# Patient Record
Sex: Female | Born: 1935 | Race: Black or African American | Hispanic: No
Health system: Southern US, Community
[De-identification: ages and names within clinical notes are randomized; demographics above are authoritative.]

## PROBLEM LIST (undated history)

## (undated) DIAGNOSIS — K219 Gastro-esophageal reflux disease without esophagitis: Secondary | ICD-10-CM

## (undated) DIAGNOSIS — E7800 Pure hypercholesterolemia, unspecified: Secondary | ICD-10-CM

---

## 1998-05-29 ENCOUNTER — Emergency Department (HOSPITAL_COMMUNITY): Admission: EM | Admit: 1998-05-29 | Discharge: 1998-05-29 | Payer: Self-pay | Admitting: Emergency Medicine

## 1998-06-16 ENCOUNTER — Other Ambulatory Visit: Admission: RE | Admit: 1998-06-16 | Discharge: 1998-06-16 | Payer: Self-pay | Admitting: Gynecology

## 1998-08-12 ENCOUNTER — Other Ambulatory Visit: Admission: RE | Admit: 1998-08-12 | Discharge: 1998-08-12 | Payer: Self-pay | Admitting: Gynecology

## 2020-01-26 ENCOUNTER — Emergency Department (HOSPITAL_COMMUNITY): Payer: Medicare HMO

## 2020-01-26 ENCOUNTER — Emergency Department (HOSPITAL_COMMUNITY)
Admission: EM | Admit: 2020-01-26 | Discharge: 2020-01-26 | Disposition: A | Discharge status: Home / Self Care | Payer: Medicare HMO | Attending: Emergency Medicine | Admitting: Emergency Medicine

## 2020-01-26 ENCOUNTER — Other Ambulatory Visit: Payer: Self-pay

## 2020-01-26 ENCOUNTER — Encounter (HOSPITAL_COMMUNITY): Payer: Self-pay

## 2020-01-26 DIAGNOSIS — Z79899 Other long term (current) drug therapy: Secondary | ICD-10-CM | POA: Insufficient documentation

## 2020-01-26 DIAGNOSIS — R079 Chest pain, unspecified: Secondary | ICD-10-CM

## 2020-01-26 DIAGNOSIS — R42 Dizziness and giddiness: Secondary | ICD-10-CM

## 2020-01-26 DIAGNOSIS — R0789 Other chest pain: Secondary | ICD-10-CM | POA: Diagnosis not present

## 2020-01-26 DIAGNOSIS — R519 Headache, unspecified: Secondary | ICD-10-CM

## 2020-01-26 HISTORY — DX: Pure hypercholesterolemia, unspecified: E78.00

## 2020-01-26 HISTORY — DX: Gastro-esophageal reflux disease without esophagitis: K21.9

## 2020-01-26 LAB — CBC
HCT: 43.6 % (ref 36.0–46.0)
Hemoglobin: 14.1 g/dL (ref 12.0–15.0)
MCH: 28.8 pg (ref 26.0–34.0)
MCHC: 32.3 g/dL (ref 30.0–36.0)
MCV: 89.2 fL (ref 80.0–100.0)
Platelets: 250 10*3/uL (ref 150–400)
RBC: 4.89 MIL/uL (ref 3.87–5.11)
RDW: 12.3 % (ref 11.5–15.5)
WBC: 4.6 10*3/uL (ref 4.0–10.5)
nRBC: 0 % (ref 0.0–0.2)

## 2020-01-26 LAB — BASIC METABOLIC PANEL
Anion gap: 8 (ref 5–15)
BUN: 7 mg/dL — ABNORMAL LOW (ref 8–23)
CO2: 25 mmol/L (ref 22–32)
Calcium: 9.4 mg/dL (ref 8.9–10.3)
Chloride: 107 mmol/L (ref 98–111)
Creatinine, Ser: 0.83 mg/dL (ref 0.44–1.00)
GFR calc Af Amer: >60 mL/min (ref >60)
GFR calc non Af Amer: >60 mL/min (ref >60)
Glucose, Bld: 100 mg/dL — ABNORMAL HIGH (ref 70–99)
Potassium: 4 mmol/L (ref 3.5–5.1)
Sodium: 140 mmol/L (ref 135–145)

## 2020-01-26 LAB — TROPONIN I (HIGH SENSITIVITY): Troponin I (High Sensitivity): <2 ng/L (ref <18)

## 2020-01-26 MED ORDER — ACETAMINOPHEN 325 MG PO TABS
650.0000 mg | ORAL_TABLET | Freq: Once | ORAL | Status: AC
  Administered 2020-01-26: 20:00:00 650 mg via ORAL
  Filled 2020-01-26: qty 2

## 2020-01-26 MED ORDER — MECLIZINE HCL 12.5 MG PO TABS: 12.5000 mg | ORAL_TABLET | Freq: Three times a day (TID) | ORAL | 0 refills | Status: AC | PRN

## 2020-01-26 MED ORDER — SODIUM CHLORIDE 0.9 % IV BOLUS
500.0000 mL | Freq: Once | INTRAVENOUS | Status: AC
  Administered 2020-01-26: 500 mL via INTRAVENOUS

## 2020-01-26 MED ORDER — MECLIZINE HCL 25 MG PO TABS
25.0000 mg | ORAL_TABLET | Freq: Once | ORAL | Status: AC
  Administered 2020-01-26: 25 mg via ORAL
  Filled 2020-01-26: qty 1

## 2020-01-26 NOTE — ED Provider Notes (Signed)
[facility] EMERGENCY DEPARTMENT Provider Note   CSN: [id number] Arrival date & time: 01/26/20  1543     History Chief Complaint  Patient presents with  . Dizziness  . Headache  . Sore Throat    sensation of something stuck in throat    Kristen Ellis is a 85 y.o. female.  Kristen Ellis is a 85 y.o. female with a history of high cholesterol and GERD, who presents to the emergency department for evaluation of headache and dizziness.  Patient states that yesterday evening she started to feel dizzy like she was going to lose her balance when she was walking and she noted a frontal headache that developed gradually.  She reports headache has persisted throughout the day today, and she was not able to sleep last night due to her discomfort.  She reports she is continued to have periods of dizziness.  These are present intermittently but not just with position changes.  She denies any associated changes in her vision.  Denies any facial asymmetry or changes in speech.  No numbness tingling or weakness in her extremities.  Despite feeling dizzy she has not fallen down or hit her head.  She reports she does not typically have headaches, has not taken anything for her head or dizziness.  Denies any similar history of vertigo.  No history of stroke.  Patient did notice a little bit of chest discomfort earlier today that is since resolved.  She is not sure what brought this on, no associated shortness of breath.  Pain was not radiating.  No other aggravating or alleviating factors.        Past Medical History:  Diagnosis Date  . GERD (gastroesophageal reflux disease)   . High cholesterol     There are no problems to display for this patient.   History reviewed. No pertinent surgical history.   OB History   No obstetric history on file.     No family history on file.  Social History   Tobacco Use  . Smoking status: Not on file  Substance Use Topics  . Alcohol  use: Not on file  . Drug use: Not on file    Home Medications Prior to Admission medications   Medication Sig Start Date End Date Taking? Authorizing Provider  acetaminophen (TYLENOL) 650 MG CR tablet Take 650 mg by mouth daily as needed for pain.   Yes [provider]  omeprazole (PRILOSEC) 40 MG capsule Take 40 mg by mouth daily with lunch. 01/13/20  Yes [provider]  rosuvastatin (CRESTOR) 20 MG tablet Take 20 mg by mouth at bedtime. 10/13/19  Yes [provider]  meclizine (ANTIVERT) 12.5 MG tablet Take 1 tablet (12.5 mg total) by mouth 3 (three) times daily as needed for dizziness. 01/26/20   Dartha Lodge, PA-C    Allergies    Patient has no known allergies.  Review of Systems   Review of Systems  Constitutional: Negative for chills and fever.  HENT: Negative.   Eyes: Negative for visual disturbance.  Cardiovascular: Positive for chest pain.  Gastrointestinal: Negative for abdominal pain, nausea and vomiting.  Genitourinary: Negative for dysuria and frequency.  Musculoskeletal: Negative for arthralgias, myalgias and neck pain.  Neurological: Positive for dizziness and headaches. Negative for syncope, facial asymmetry, speech difficulty, weakness, light-headedness and numbness.  All other systems reviewed and are negative.   Physical Exam Updated Vital Signs BP (!) 152/94 (BP Location: Left Arm)   Pulse 75  Temp 98.2 F (36.8 C) (Oral)   Resp 16   Ht 5\' 4"  (1.626 m)   Wt 59.9 kg   SpO2 96%   BMI 22.66 kg/m   Physical Exam Vitals and nursing note reviewed.  Constitutional:      General: She is not in acute distress.    Appearance: She is well-developed and normal weight. She is not ill-appearing or diaphoretic.  HENT:     Head: Normocephalic and atraumatic.  Eyes:     General:        Right eye: No discharge.        Left eye: No discharge.     Extraocular Movements: Extraocular movements intact.     Right eye: No nystagmus.      Left eye: No nystagmus.     Pupils: Pupils are equal, round, and reactive to light.  Cardiovascular:     Rate and Rhythm: Normal rate and regular rhythm.     Heart sounds: Normal heart sounds. No murmur. No friction rub. No gallop.   Pulmonary:     Effort: Pulmonary effort is normal. No respiratory distress.     Breath sounds: Normal breath sounds. No wheezing or rales.     Comments: Respirations equal and unlabored, patient able to speak in full sentences, lungs clear to auscultation bilaterally Abdominal:     General: Bowel sounds are normal. There is no distension.     Palpations: Abdomen is soft. There is no mass.     Tenderness: There is no abdominal tenderness. There is no guarding.     Comments: Abdomen soft, nondistended, nontender to palpation in all quadrants without guarding or peritoneal signs  Musculoskeletal:        General: No deformity.     Cervical back: Neck supple.  Skin:    General: Skin is warm and dry.     Capillary Refill: Capillary refill takes less than 2 seconds.  Neurological:     Mental Status: She is alert and oriented to person, place, and time.     Coordination: Coordination normal.     Comments: Speech is clear, able to follow commands CN III-XII intact Normal strength in upper and lower extremities bilaterally including dorsiflexion and plantar flexion, strong and equal grip strength Sensation normal to light and sharp touch Moves extremities without ataxia, coordination intact Normal finger to nose and rapid alternating movements No pronator drift  Psychiatric:        Mood and Affect: Mood normal.        Behavior: Behavior normal.     ED Results / Procedures / Treatments   Labs (all labs ordered are listed, but only abnormal results are displayed) Labs Reviewed  BASIC METABOLIC PANEL - Abnormal; Notable for the following components:      Result Value   Glucose, Bld 100 (*)    BUN 7 (*)    All other components within normal limits  CBC    TROPONIN I (HIGH SENSITIVITY)    EKG None  Radiology CT Head Wo Contrast  Result Date: 01/26/2020 CLINICAL DATA:  Acute headaches EXAM: CT HEAD WITHOUT CONTRAST TECHNIQUE: Contiguous axial images were obtained from the base of the skull through the vertex without intravenous contrast. COMPARISON:  None. FINDINGS: Brain: No evidence of acute infarction, hemorrhage, hydrocephalus, extra-axial collection or mass lesion/mass effect. Vascular: No hyperdense vessel or unexpected calcification. Skull: Normal. Negative for fracture or focal lesion. Sinuses/Orbits: No acute finding. Other: None. IMPRESSION: Normal head CT for age. Electronically Signed  By: Alcide Clever M.D.   On: 01/26/2020 20:35   MR BRAIN WO CONTRAST  Result Date: 01/26/2020 CLINICAL DATA:  Initial evaluation for acute vertigo. EXAM: MRI HEAD WITHOUT CONTRAST TECHNIQUE: Multiplanar, multiecho pulse sequences of the brain and surrounding structures were obtained without intravenous contrast. COMPARISON:  Prior CT from earlier the same day. FINDINGS: Brain: Examination degraded by motion artifact. Generalized age-related cerebral atrophy. Patchy and confluent T2/FLAIR hyperintensity within the periventricular deep white matter both cerebral hemispheres most consistent with chronic small vessel ischemic disease, moderate in nature. Patchy involvement of the pons noted. No abnormal foci of restricted diffusion to suggest acute or subacute ischemia. Gray-white matter differentiation maintained. No encephalomalacia to suggest chronic cortical infarction. No acute intracranial hemorrhage. Single punctate chronic microhemorrhage noted within the right periventricular white matter, likely small vessel related. No mass lesion, midline shift or mass effect. No hydrocephalus. No extra-axial fluid collection. Pituitary gland suprasellar region normal. Midline structures intact. Focal thinning of the posterior body of the corpus callosum related to small  vessel ischemic changes noted. Vascular: Major intracranial vascular flow voids are maintained. Skull and upper cervical spine: Craniocervical junction normal. Multilevel degenerative spondylosis noted within the upper cervical spine without high-grade stenosis. Bone marrow signal intensity within normal limits. No scalp soft tissue abnormality. Sinuses/Orbits: Globes and orbital soft tissues within normal limits. Paranasal sinuses are largely clear. No mastoid effusion. Inner ear structures normal. Other: None. IMPRESSION: 1. No acute intracranial abnormality. 2. Age-related cerebral atrophy with moderate chronic small vessel ischemic disease. Electronically Signed   By: Rise Mu M.D.   On: 01/26/2020 22:40   DG Chest Portable 1 View  Result Date: 01/26/2020 CLINICAL DATA:  Chest pain EXAM: PORTABLE CHEST 1 VIEW COMPARISON:  None. FINDINGS: Cardiac shadow is mildly enlarged but accentuated by the portable technique. Tortuosity of the thoracic aorta is noted. Calcifications are seen without aneurysmal dilatation. Lungs are clear bilaterally. No acute bony abnormality is seen. IMPRESSION: No acute abnormality noted. Aortic Atherosclerosis (ICD10-I70.0). Electronically Signed   By: Alcide Clever M.D.   On: 01/26/2020 19:40    Procedures Procedures (including critical care time)  Medications Ordered in ED Medications  acetaminophen (TYLENOL) tablet 650 mg (650 mg Oral Given 01/26/20 1941)  meclizine (ANTIVERT) tablet 25 mg (25 mg Oral Given 01/26/20 1942)  sodium chloride 0.9 % bolus 500 mL (0 mLs Intravenous Stopped 01/26/20 2010)    ED Course  I have reviewed the triage vital signs and the nursing notes.  Pertinent labs & imaging results that were available during my care of the patient were reviewed by me and considered in my medical decision making (see chart for details).    MDM Rules/Calculators/A&P                     85 year old female presents with frontal headache and dizziness.   On arrival she has normal vitals and is well-appearing and in no acute distress.  She denies similar history of headaches or dizziness.  Headache was gradual in onset and is not associated with any other neurologic deficits.  She reports a brief episode of chest pain earlier today, no additional chest pain.  Neurologic exam is reassuring today but given new onset dizziness that is not positional concern for potential central vertigo, in the setting of headache will get CT head to rule out bleed.  Tylenol and meclizine given.  We will also get basic labs, troponin, EKG and chest x-ray.  EKG without concerning ischemic  changes, troponin is negative, do not feel that delta troponin is indicated as pain occurred several hours ago and has not continued.  Chest x-ray is clear.  Lab work very reassuring without leukocytosis, normal hemoglobin, no acute electrolyte derangements.  CT scan of the head without evidence of bleed or other acute abnormality.  Will get MRI to rule out acute stroke as cause for vertigo.  MRI is reassuring without evidence of acute stroke, age-related cerebral atrophy and chronic ischemic changes noted.  I discussed these reassuring results with the patient who states that she is feeling much better, her headache has resolved with Tylenol and dizziness has resolved with meclizine.  She has been able to ambulate to the bathroom stably without difficulty.  Suspect peripheral vertigo.  Will have patient continue to treat at home with meclizine, given information on Epley maneuvers as well.  Tylenol for headaches.  Encouraged to follow-up with neurology if symptoms continue.  Return precautions discussed.  Patient expresses understanding and agreement.  Discharged home in good condition.  Final Clinical Impression(s) / ED Diagnoses Final diagnoses:  Dizziness  Acute nonintractable headache, unspecified headache type  Chest pain, unspecified type    Rx / DC Orders ED Discharge Orders          Ordered    meclizine (ANTIVERT) 12.5 MG tablet  3 times daily PRN     01/26/20 2254           Jacqlyn Larsen, PA-C 01/26/20 2332    Margette Fast, MD 01/27/20 1116

## 2020-01-26 NOTE — ED Notes (Signed)
Pt ambulated with a smooth and steady gait.

## 2020-01-26 NOTE — ED Notes (Signed)
Pt was discharged from the ED. Pt read and understood discharge paperwork. Pt had vital signs completed. Pt conscious, breathing, and A&Ox4. No distress noted. Pt speaking in complete sentences. Pt ambulated out of the ED with a smooth and steady gait. E-signature not available.  

## 2020-01-26 NOTE — ED Notes (Signed)
Pt to MRI via cart. Pt conscious, breathing and A&Ox4.  

## 2020-01-26 NOTE — Discharge Instructions (Addendum)
Your work-up today is very reassuring, I do not see any evidence of stroke.  I think your symptoms are likely due to peripheral vertigo, you can use meclizine at home as needed for dizziness, follow the instructions provided for Epley maneuvers, these are exercises to help improve your balance and dizziness.  You can take Tylenol as needed for headaches.  Follow-up with neurology regarding the symptoms.

## 2020-01-26 NOTE — ED Notes (Signed)
Pt came to the ED per triage complaint. Pt conscious, breathing, and A&Ox4. Pt brought back to [address] via wheelchair. Pt endorses "I had head pain that started yesterday along with dizziness". Chest rise and fall equally with non-labored breathing. Lungs clear apex to base. Abd soft and non-tender. Pt denies chest pain, n/v/d, shortness of breath, and f/c.  Pt endorses pain 8 out of 10 pain that's vice-like. PIVC placed on the RAC with a 20G which had positive blood return and flushed without pain or infiltration. Blood collected, labeled, and sent to lab. Bed in lowest position with call light within reach. Pt on continuous blood pressure, pulse ox, and cardiac monitor. Will continue to monitor. Awaiting MD eval. No distress noted. IVF infusing per MAR. Medications given and charted per [facility].

## 2020-01-26 NOTE — ED Triage Notes (Signed)
Pt reports sore throat, states it feels like there is something stuck there. Denies any SOB or difficulty swallowing. Pt also reports generalized weakness. Pt a.o.

## 2021-07-15 IMAGING — CT CT HEAD W/O CM
3 series · 16 of 47 positions shown, 19 images · non-contrast
Comparison: None.

CLINICAL DATA: Acute headaches

EXAM:
CT HEAD WITHOUT CONTRAST
TECHNIQUE: Contiguous axial images were obtained from the base of the skull
through the vertex without intravenous contrast.

[Series 3: head 5.0 h30s · axial · 0.39mm/px · z∈[-163,-33]mm · 10 of 32 slices shown, 13 images]
[im 3/32  brain]
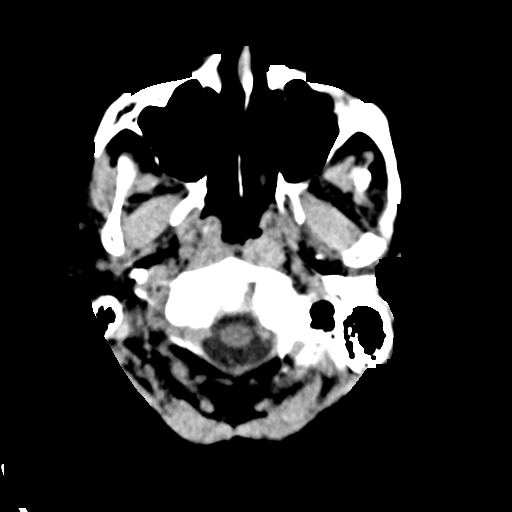
[im 3/32  bone]
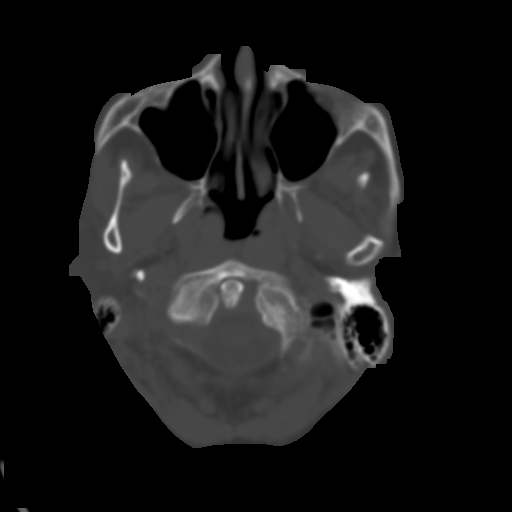
[im 6/32  brain]
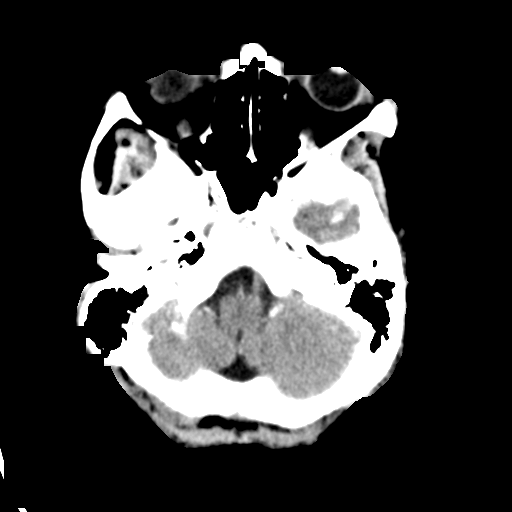
[im 9/32  brain]
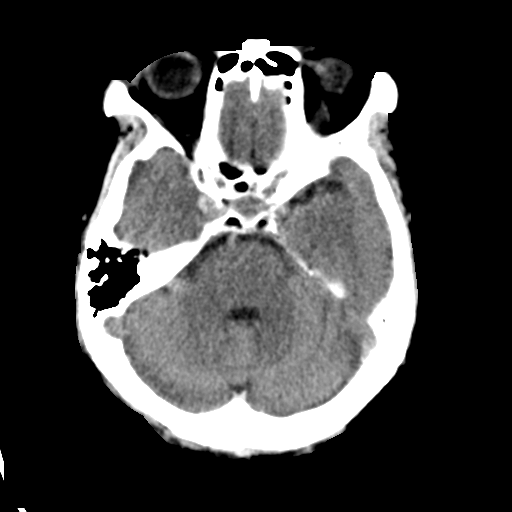
[im 11/32  brain]
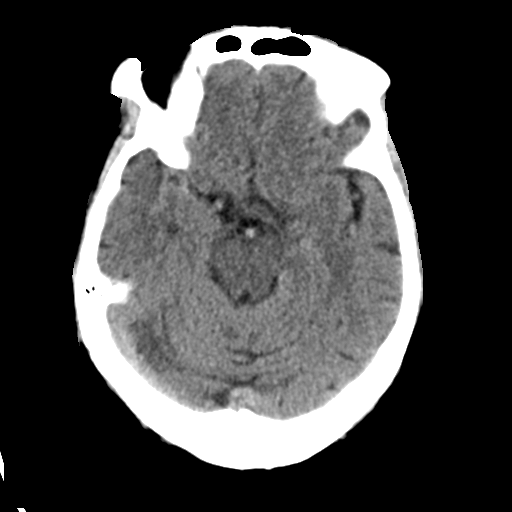
[im 14/32  brain]
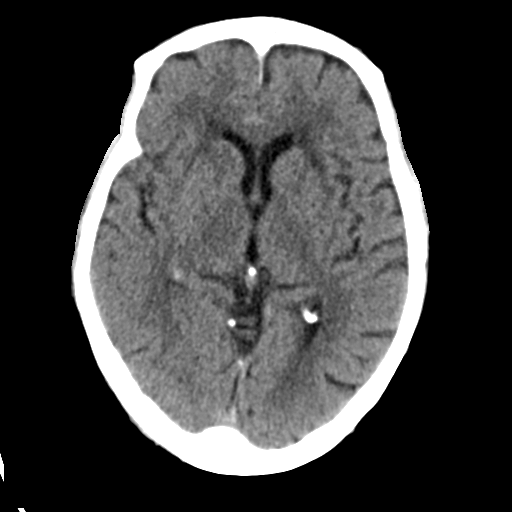
[im 14/32  bone]
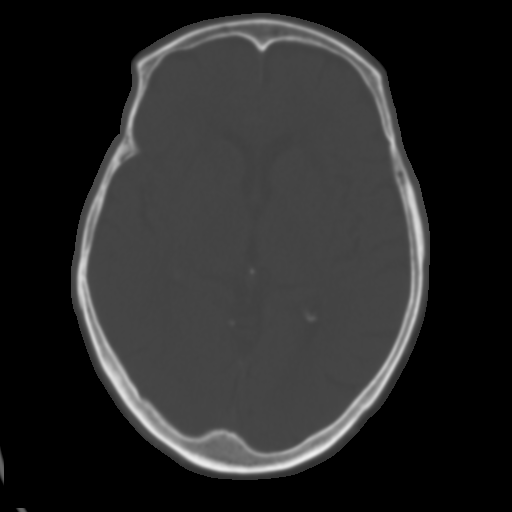
[im 18/32  brain]
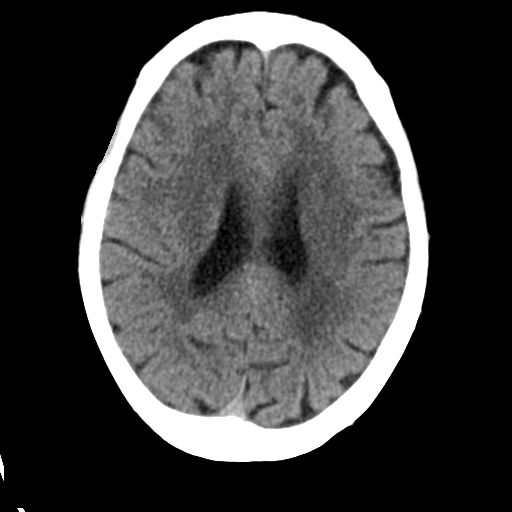
[im 21/32  brain]
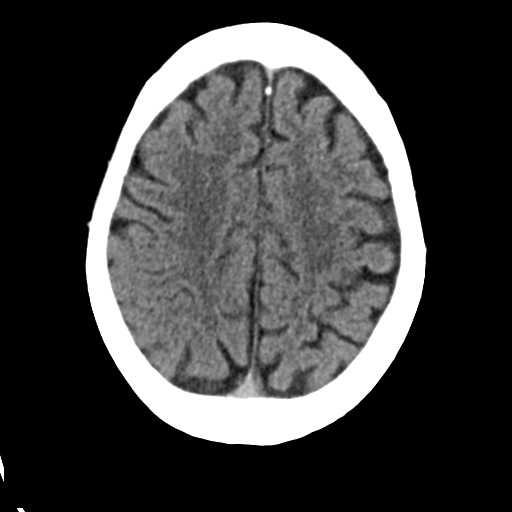
[im 24/32  brain]
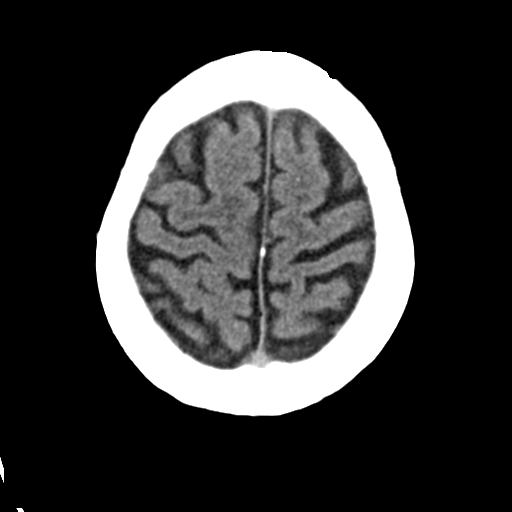
[im 26/32  brain]
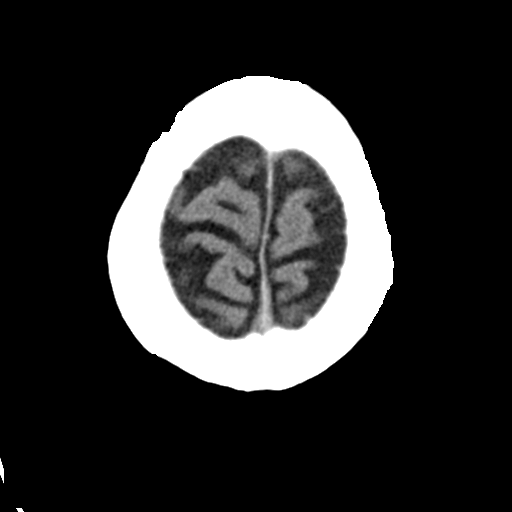
[im 26/32  bone]
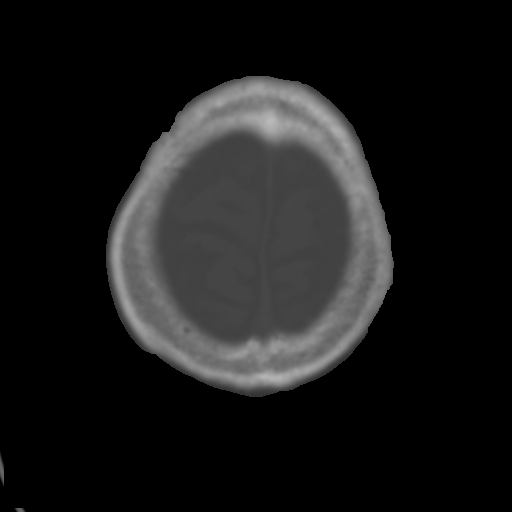
[im 29/32  brain]
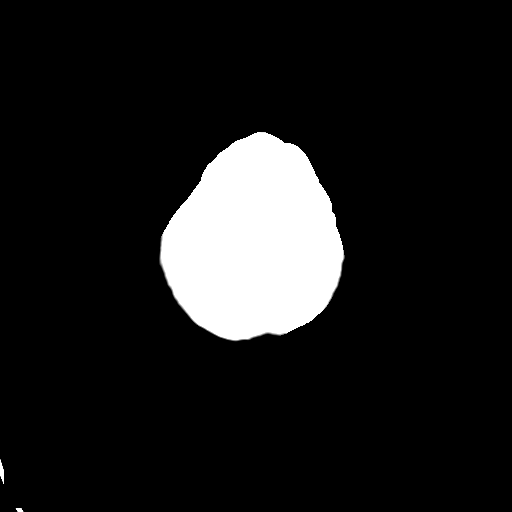

[Series 5: head 3.0 mpr cor · coronal · 0.32mm/px · 3 of 67 slices shown]
[im 23/67  brain]
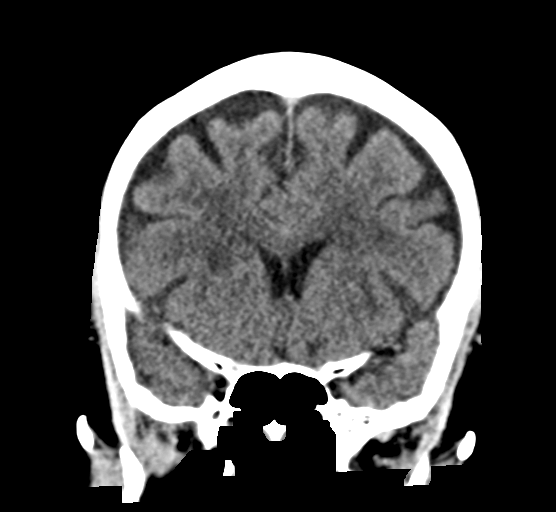
[im 30/67  brain]
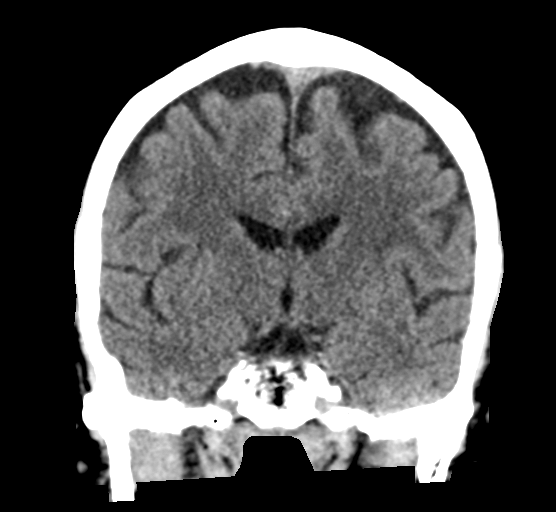
[im 37/67  brain]
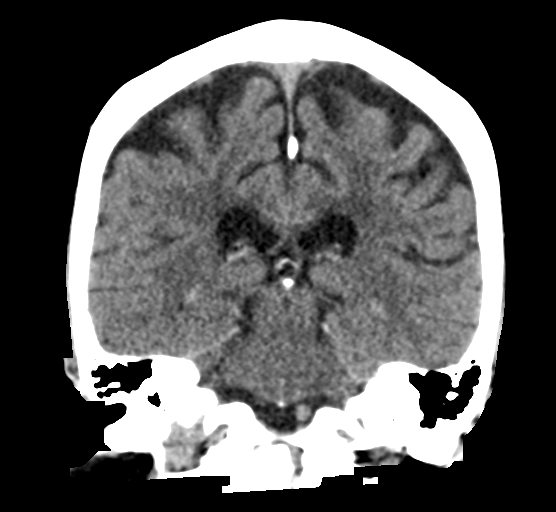

[Series 6: head 3.0 mpr sag · sagittal · 0.32mm/px · 3 of 59 slices shown]
[im 20/59  brain]
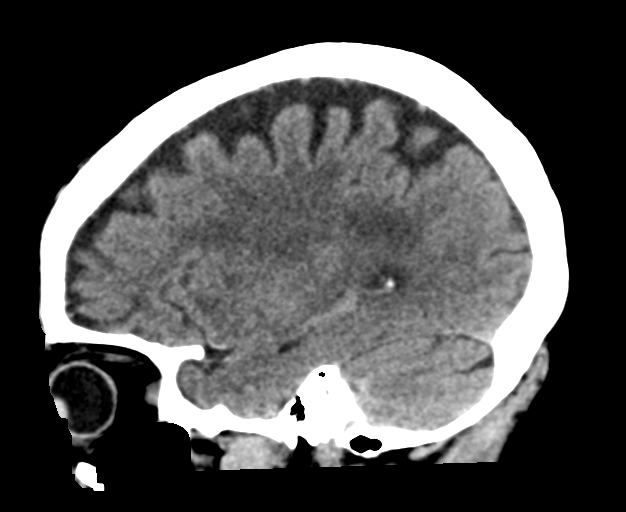
[im 30/59  brain]
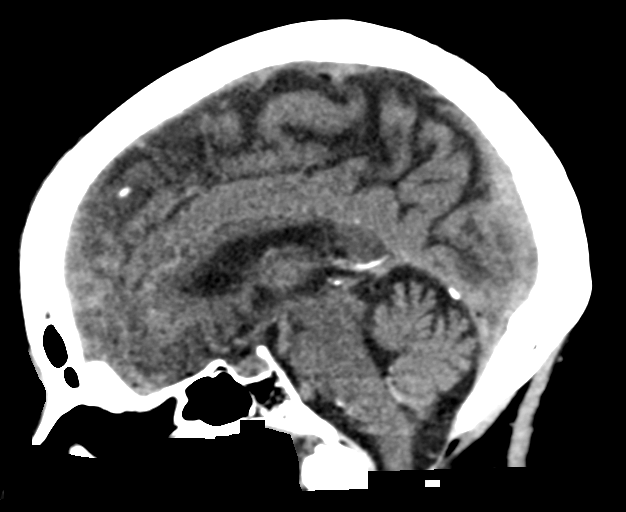
[im 39/59  brain]
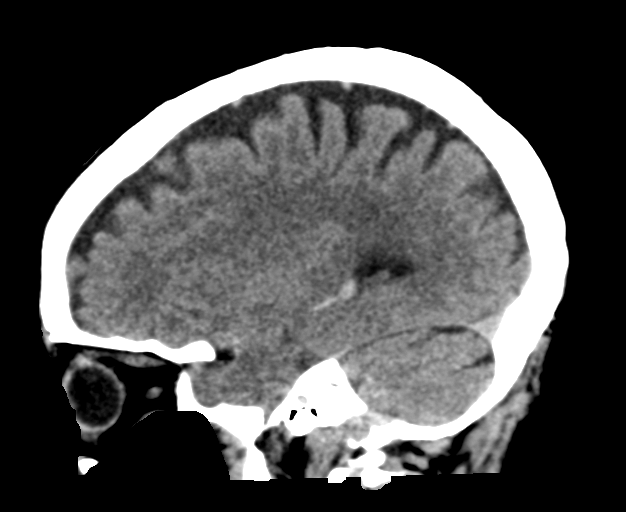

[16 of 47 positions shown; findings below may reference images not displayed]

FINDINGS: Brain: No evidence of acute infarction, hemorrhage, hydrocephalus,
extra-axial collection or mass lesion/mass effect.

Vascular: No hyperdense vessel or unexpected calcification.

Skull: Normal. Negative for fracture or focal lesion.

Sinuses/Orbits: No acute finding.

Other: None.
IMPRESSION: Normal head CT for age.
